# Patient Record
Sex: Male | Born: 1970 | Race: White | Hispanic: No | Smoking: Former smoker
Health system: Southern US, Community
[De-identification: ages and names within clinical notes are randomized; demographics above are authoritative.]

---

## 2005-06-17 ENCOUNTER — Ambulatory Visit: Payer: Self-pay | Admitting: Internal Medicine

## 2005-09-03 ENCOUNTER — Ambulatory Visit: Payer: Self-pay | Admitting: Oncology

## 2005-09-09 LAB — CBC WITH DIFFERENTIAL/PLATELET
LYMPH%: 22.8 % (ref 14.0–48.0)
MCH: 31 pg (ref 28.0–33.4)
MCV: 89.7 fL (ref 81.6–98.0)
MONO%: 4.8 % (ref 0.0–13.0)
NEUT#: 4.7 10*3/uL (ref 1.5–6.5)
NEUT%: 69.6 % (ref 40.0–75.0)
Platelets: 180 10*3/uL (ref 145–400)
RDW: 12.8 % (ref 11.2–14.6)
WBC: 6.7 10*3/uL (ref 4.0–10.0)
lymph#: 1.5 10*3/uL (ref 0.9–3.3)

## 2005-09-09 LAB — COMPREHENSIVE METABOLIC PANEL
Alkaline Phosphatase: 97 U/L (ref 39–117)
Potassium: 3.9 mEq/L (ref 3.5–5.3)
Sodium: 138 mEq/L (ref 135–145)
Total Bilirubin: 0.7 mg/dL (ref 0.3–1.2)

## 2005-09-11 ENCOUNTER — Ambulatory Visit (HOSPITAL_COMMUNITY): Admission: RE | Admit: 2005-09-11 | Discharge: 2005-09-11 | Payer: Self-pay | Admitting: Oncology

## 2005-09-12 LAB — BETA HCG QUANT (REF LAB): Beta hCG, Tumor Marker: <1 IU/L (ref 0–3)

## 2005-10-27 ENCOUNTER — Ambulatory Visit: Payer: Self-pay | Admitting: Oncology

## 2006-01-26 ENCOUNTER — Ambulatory Visit: Payer: Self-pay | Admitting: Oncology

## 2007-04-16 IMAGING — CT CT ABDOMEN W/ CM
2 of 6 series · 17 of 46 positions shown, 19 images · non-contrast
Comparison: none

HISTORY: Testicular cancer status post chemotherapy, cough

[Series 2: cap 5.0 b40f st · axial · 0.71mm/px · z∈[+780,+1404]mm · 14 of 143 slices shown, 16 images]
[im 9/143  soft-tissue]
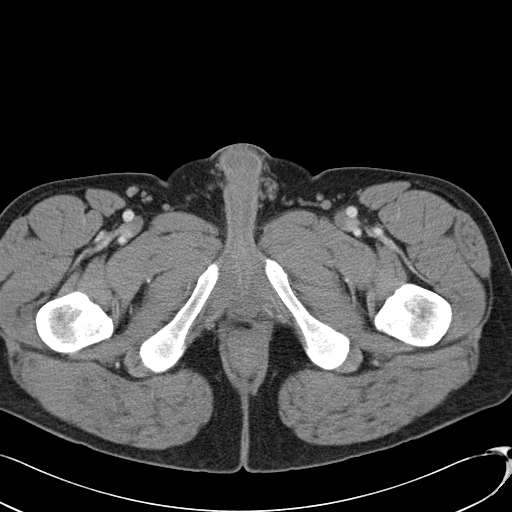
[im 9/143  bone]
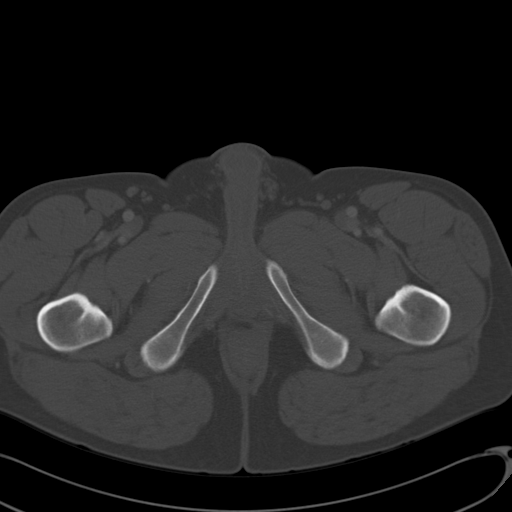
[im 17/143  soft-tissue]
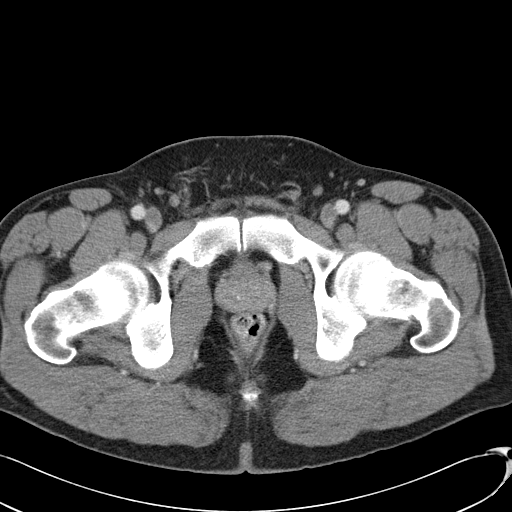
[im 26/143  soft-tissue]
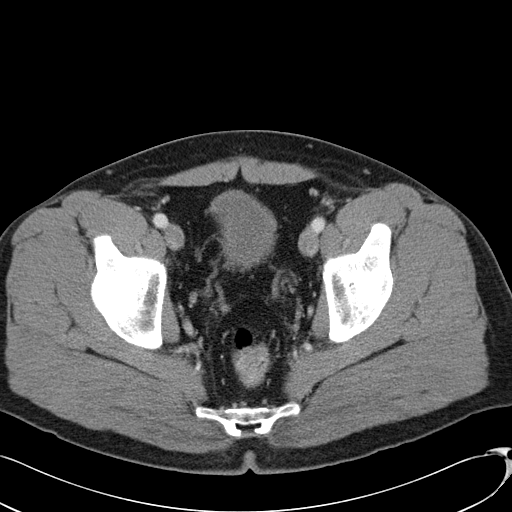
[im 42/143  soft-tissue]
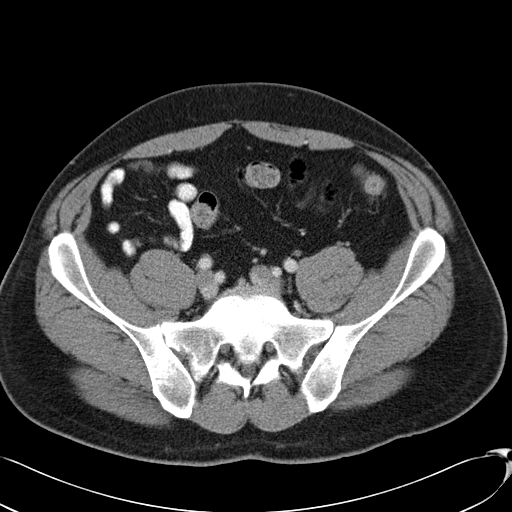
[im 51/143  soft-tissue]
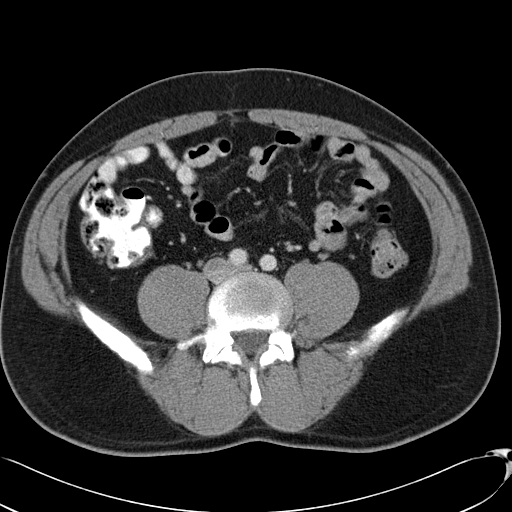
[im 59/143  soft-tissue]
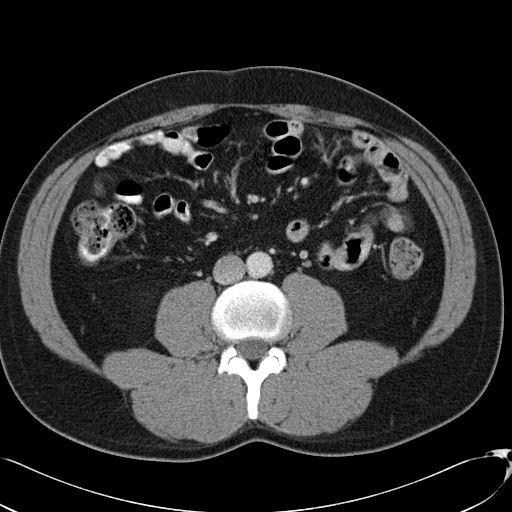
[im 67/143  soft-tissue]
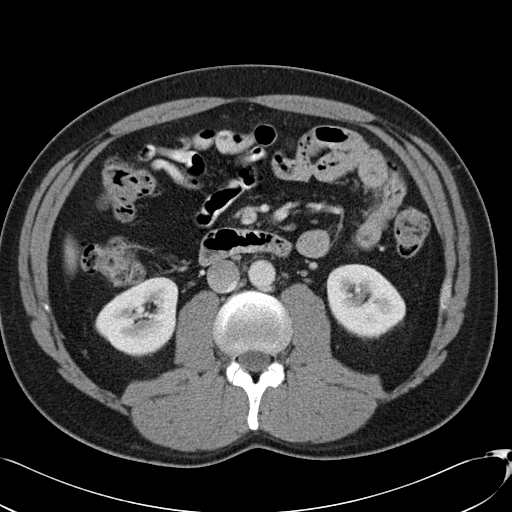
[im 76/143  soft-tissue]
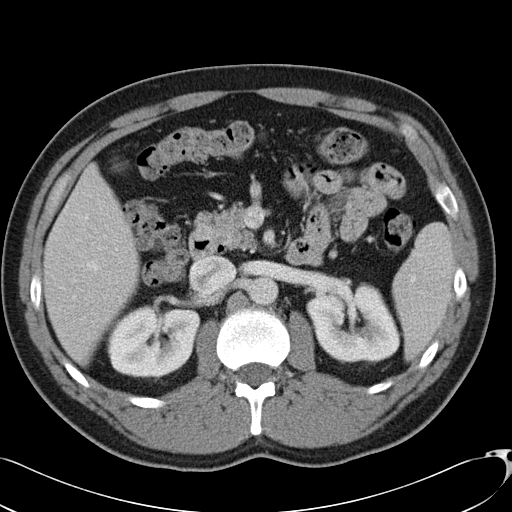
[im 84/143  soft-tissue]
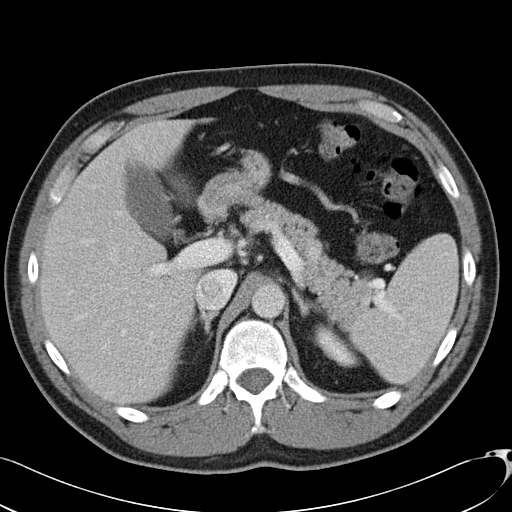
[im 84/143  bone]
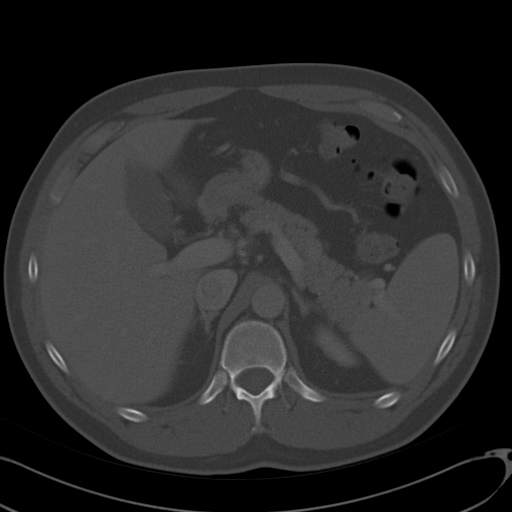
[im 92/143  soft-tissue]
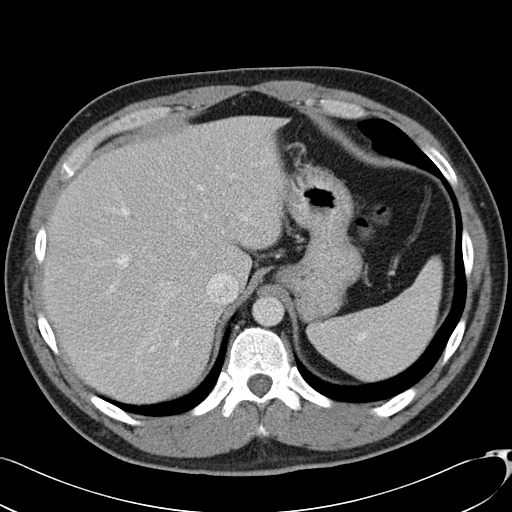
[im 109/143  soft-tissue]
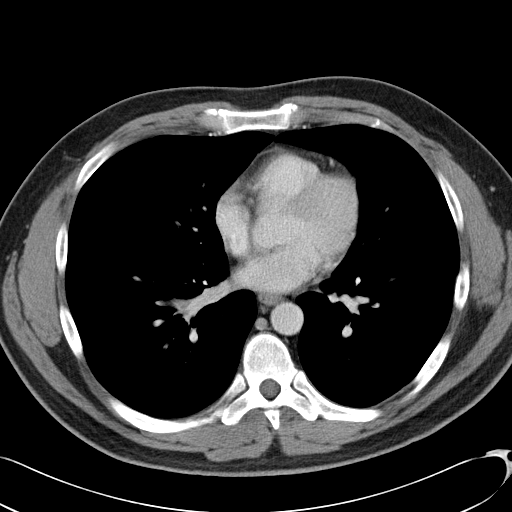
[im 117/143  soft-tissue]
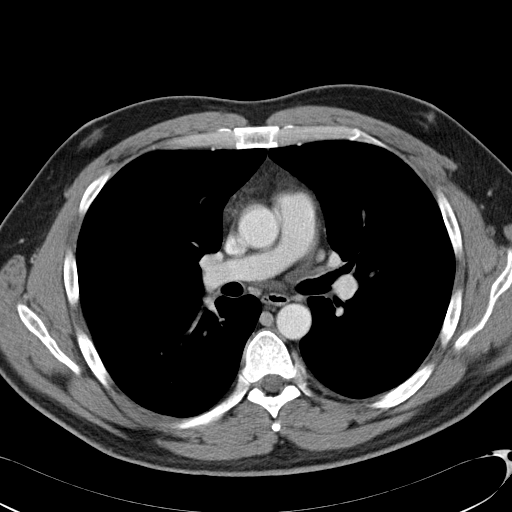
[im 126/143  soft-tissue]
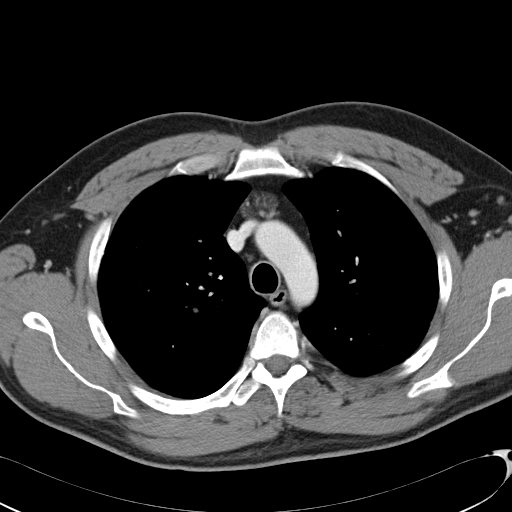
[im 134/143  soft-tissue]
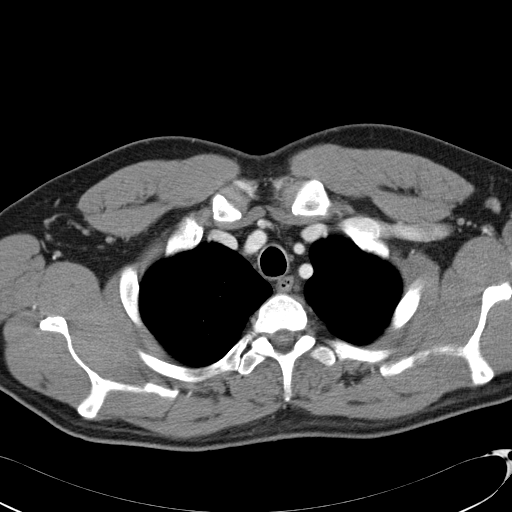

[Series 602: cor · coronal · 1.43mm/px · 3 of 50 slices shown]
[im 17/50  soft-tissue]
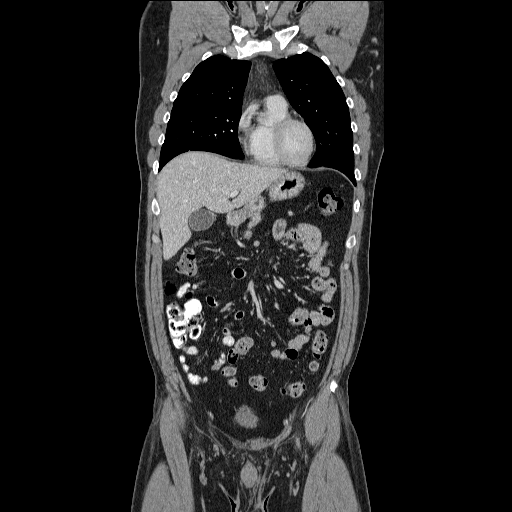
[im 22/50  soft-tissue]
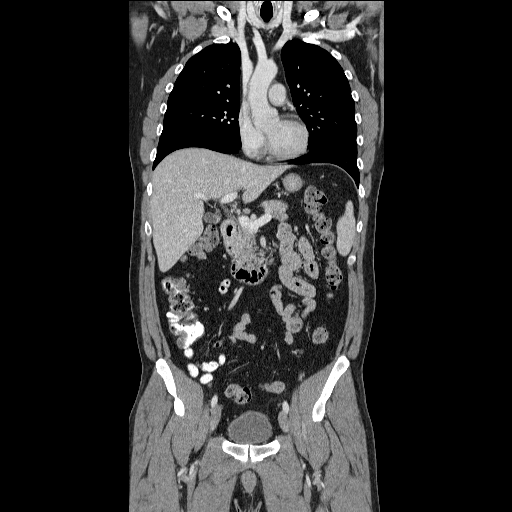
[im 28/50  soft-tissue]
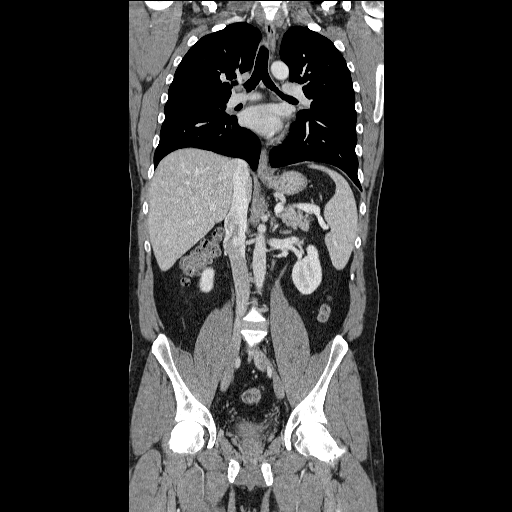

[17 of 46 positions shown; findings below may reference images not displayed]

CT CHEST, ABDOMEN, AND PELVIS WITH CONTRAST:

Multidetector helical CT imaging of chest, abdomen, and pelvis performed.
Exam utilized dilute oral contrast to monitor 25 cc [id number].
No prior examinations available for comparison

CT CHEST:

Thoracic vascular structures patent.
Normal size mediastinal, right hilar, and left axillary lymph nodes.
No pulmonary infiltrate or pleural effusion.
Minimal stranding density in anterior mediastinum likely residual thymus.
Tiny bilateral pulmonary nodules less than 5 mm diameter, nonspecific.
The report from the patient's prior outside CT mentions pulmonary nodules but
the films are not available for direct comparison.
No definite bone metastases.
IMPRESSION: Tiny bilateral pulmonary nodules, nonspecific.
Nodules are described on the report from a prior outside CT.
Recommend prior exams be obtained for direct comparison to assess for interval
change.

CT ABDOMEN:

Tiny nodular foci within gallbladder, question tiny polyps or nondependent
stones.
Liver, spleen, pancreas, kidneys, and adrenal glands normal.
Upper abdominal bowel loops incompletely opacified but otherwise grossly normal
in appearance.
No mass, adenopathy, free fluid, or inflammatory process.
IMPRESSION: No evidence of metastatic disease.
Question tiny gallbladder polyps versus stones.
If clinically indicated, this can be evaluated by ultrasound.

CT PELVIS:

Normal appendix.
Pelvic bowel loops unremarkable.
No pelvic mass, adenopathy, or free fluid.
No distal ureteral calcification or dilatation.
Bones unremarkable.
IMPRESSION: No evidence of metastatic disease.

## 2022-04-01 ENCOUNTER — Encounter: Payer: Self-pay | Admitting: Internal Medicine

## 2022-04-01 ENCOUNTER — Encounter: Payer: 59 | Admitting: Internal Medicine

## 2022-04-01 ENCOUNTER — Ambulatory Visit (INDEPENDENT_AMBULATORY_CARE_PROVIDER_SITE_OTHER): Payer: 59 | Admitting: Internal Medicine

## 2022-04-01 VITALS — BP 122/84 | HR 87 | Temp 98.2°F | Resp 18 | Ht 72.0 in | Wt 231.4 lb

## 2022-04-01 DIAGNOSIS — H8112 Benign paroxysmal vertigo, left ear: Secondary | ICD-10-CM

## 2022-04-01 DIAGNOSIS — Z131 Encounter for screening for diabetes mellitus: Secondary | ICD-10-CM

## 2022-04-01 DIAGNOSIS — N529 Male erectile dysfunction, unspecified: Secondary | ICD-10-CM | POA: Diagnosis not present

## 2022-04-01 DIAGNOSIS — Z0000 Encounter for general adult medical examination without abnormal findings: Secondary | ICD-10-CM | POA: Diagnosis not present

## 2022-04-01 DIAGNOSIS — Z125 Encounter for screening for malignant neoplasm of prostate: Secondary | ICD-10-CM | POA: Insufficient documentation

## 2022-04-01 MED ORDER — SILDENAFIL CITRATE 100 MG PO TABS: 100.0000 mg | ORAL_TABLET | ORAL | 11 refills | Status: DC | PRN

## 2022-04-01 MED ORDER — MECLIZINE HCL 25 MG PO TABS: 25.0000 mg | ORAL_TABLET | Freq: Three times a day (TID) | ORAL | 3 refills | Status: DC | PRN

## 2022-04-01 NOTE — Progress Notes (Addendum)
   Established Patient Office Visit  Subjective   Patient ID: Erik Henry, male    DOB: 04/06/1971  Age: 51 y.o. MRN: [id number]  Chief Complaint  Patient presents with   Annual Exam   Erectile Dysfunction    Erectile Dysfunction   51 years old male is here for annual physical exam, he has sinusitis and get positional vertigo.  Does not take any medications.  He denies having any specific complaint.  He does have a history of erectile dysfunction and takes sildenafil as needed.  He has testicular cancer and receive chemo therapy.   His mother died of lung cancer. Father died of colon cancer.   He work picking parts in high rise building.   He quit smoking 2 years ago after smoking 35 years 1 PPD. He occasionally drink.    Review of Systems  Constitutional: Negative.   Eyes: Negative.   Respiratory: Negative.    Cardiovascular: Negative.   Gastrointestinal: Negative.   Neurological:  Positive for dizziness.      Objective:     BP 122/84 (BP Location: Left Arm, Patient Position: Sitting)   Pulse 87   Temp 98.2 F (36.8 C)   Resp 18   Ht 6' (1.829 m)   Wt 231 lb 6 oz (105 kg)   SpO2 98%   BMI 31.38 kg/m    Physical Exam Constitutional:      Appearance: Normal appearance.  HENT:     Head: Normocephalic and atraumatic.  Eyes:     Extraocular Movements: Extraocular movements intact.     Pupils: Pupils are equal, round, and reactive to light.  Cardiovascular:     Rate and Rhythm: Normal rate and regular rhythm.     Heart sounds: Normal heart sounds.  Pulmonary:     Effort: Pulmonary effort is normal.     Breath sounds: Normal breath sounds.  Abdominal:     General: Bowel sounds are normal.     Palpations: Abdomen is soft.  Musculoskeletal:     Cervical back: Normal range of motion and neck supple.  Neurological:     General: No focal deficit present.     Mental Status: He is alert and oriented to person, place, and time.      No results found  for any visits on 04/01/22.   The ASCVD Risk score (Arnett DK, et al., 2019) failed to calculate for the following reasons:   Cannot find a previous HDL lab   Cannot find a previous total cholesterol lab    Assessment & Plan:   Problem List Items Addressed This Visit       Nervous and Auditory   Benign paroxysmal positional vertigo of left ear - Primary    He will take meclizine as needed.        Other   Erectile dysfunction    I will do testosterone level.  He will continue to take sildenafil.      Relevant Orders   Testosterone, Total (Completed)   Screening for diabetes mellitus    I will do hemoglobin A1c today.  He is BMI is 3 and he has lost some weight by monitoring his diet.      Relevant Orders   Hemoglobin A1c (Completed)    No follow-ups on file.    Garwin Brothers, MD

## 2022-04-02 LAB — CMP14 + ANION GAP
ALT: 28 IU/L (ref 0–44)
AST: 19 IU/L (ref 0–40)
Albumin/Globulin Ratio: 2.1 (ref 1.2–2.2)
Albumin: 4.6 g/dL (ref 3.8–4.9)
Alkaline Phosphatase: 105 IU/L (ref 44–121)
Anion Gap: 15 mmol/L (ref 10.0–18.0)
BUN/Creatinine Ratio: 12 (ref 9–20)
BUN: 11 mg/dL (ref 6–24)
Bilirubin Total: 0.8 mg/dL (ref 0.0–1.2)
CO2: 24 mmol/L (ref 20–29)
Calcium: 10 mg/dL (ref 8.7–10.2)
Chloride: 102 mmol/L (ref 96–106)
Creatinine, Ser: 0.94 mg/dL (ref 0.76–1.27)
Globulin, Total: 2.2 g/dL (ref 1.5–4.5)
Glucose: 120 mg/dL — ABNORMAL HIGH (ref 70–99)
Potassium: 4.3 mmol/L (ref 3.5–5.2)
Sodium: 141 mmol/L (ref 134–144)
Total Protein: 6.8 g/dL (ref 6.0–8.5)
eGFR: 97 mL/min/{1.73_m2} (ref >59)

## 2022-04-02 LAB — LIPID PANEL
Chol/HDL Ratio: 4.8 ratio (ref 0.0–5.0)
Cholesterol, Total: 183 mg/dL (ref 100–199)
HDL: 38 mg/dL — ABNORMAL LOW (ref >39)
LDL Chol Calc (NIH): 98 mg/dL (ref 0–99)
Triglycerides: 275 mg/dL — ABNORMAL HIGH (ref 0–149)
VLDL Cholesterol Cal: 47 mg/dL — ABNORMAL HIGH (ref 5–40)

## 2022-04-02 LAB — TESTOSTERONE: Testosterone: 282 ng/dL (ref 264–916)

## 2022-04-02 LAB — HEMOGLOBIN A1C
Est. average glucose Bld gHb Est-mCnc: 108 mg/dL
Hgb A1c MFr Bld: 5.4 % (ref 4.8–5.6)

## 2022-04-02 LAB — VITAMIN D 25 HYDROXY (VIT D DEFICIENCY, FRACTURES): Vit D, 25-Hydroxy: 65.3 ng/mL (ref 30.0–100.0)

## 2022-04-02 LAB — PSA: Prostate Specific Ag, Serum: 1.9 ng/mL (ref 0.0–4.0)

## 2022-04-22 NOTE — Progress Notes (Signed)
Patient called.  Patient aware.  

## 2022-07-03 NOTE — Assessment & Plan Note (Signed)
I will do testosterone level.  He will continue to take sildenafil.

## 2022-07-03 NOTE — Assessment & Plan Note (Signed)
I will do hemoglobin A1c today.  He is BMI is 3 and he has lost some weight by monitoring his diet.

## 2022-07-03 NOTE — Assessment & Plan Note (Signed)
He will take meclizine as needed.

## 2023-04-09 ENCOUNTER — Ambulatory Visit: Payer: BC Managed Care – PPO | Admitting: Internal Medicine

## 2023-04-09 ENCOUNTER — Encounter: Payer: Self-pay | Admitting: Internal Medicine

## 2023-04-09 VITALS — BP 128/90 | HR 91 | Temp 97.8°F | Resp 18 | Ht 72.0 in | Wt 230.2 lb

## 2023-04-09 DIAGNOSIS — Z6830 Body mass index (BMI) 30.0-30.9, adult: Secondary | ICD-10-CM | POA: Insufficient documentation

## 2023-04-09 DIAGNOSIS — J302 Other seasonal allergic rhinitis: Secondary | ICD-10-CM

## 2023-04-09 DIAGNOSIS — Z125 Encounter for screening for malignant neoplasm of prostate: Secondary | ICD-10-CM | POA: Diagnosis not present

## 2023-04-09 DIAGNOSIS — N529 Male erectile dysfunction, unspecified: Secondary | ICD-10-CM

## 2023-04-09 DIAGNOSIS — Z0000 Encounter for general adult medical examination without abnormal findings: Secondary | ICD-10-CM | POA: Diagnosis not present

## 2023-04-09 DIAGNOSIS — E291 Testicular hypofunction: Secondary | ICD-10-CM | POA: Diagnosis not present

## 2023-04-09 DIAGNOSIS — Z6831 Body mass index (BMI) 31.0-31.9, adult: Secondary | ICD-10-CM

## 2023-04-09 DIAGNOSIS — H8112 Benign paroxysmal vertigo, left ear: Secondary | ICD-10-CM | POA: Diagnosis not present

## 2023-04-09 MED ORDER — FLUTICASONE PROPIONATE 50 MCG/ACT NA SUSP: 1.0000 | Freq: Every day | NASAL | 2 refills | Status: AC

## 2023-04-09 NOTE — Progress Notes (Addendum)
Office Visit  Subjective   Patient ID: Erik Henry   DOB: [birth year]   Age: 52 y.o.   MRN: [id number]   Chief Complaint Chief Complaint  Patient presents with   Annual Exam     History of Present Illness 52 years old male is here for annual physical examination. He says within last whole year, he has one episode of positional vertigo. He has seasonal allergies and he use flonase and it is helping.   He has no energy and feeling weak muscles.  He also has problems with his nature and his testosterone level was low but he is not taking any replacement.  He is fasting today for repeat labs.  He has history of testicular cancer and receive chemo therapy in the past.   He is single and work as Hydrologist.    His mother died of lung cancer. Father died of pancreatic cancer.    He works as Hydrologist. He has quit smoking 3 years ago after smoking 35 years 1 PPD. He occasionally drink.   He does not get flu shot, he has COVID vaccine.   He never have colonoscopy but had stool fit test last year and wanted to do that again this year.  He says that he wake up once at nighttime to urinate but he drink a lot of sweet tea.  He denies having any difficulty urination.  Past Medical History No past medical history on file.   Allergies No Known Allergies   Review of Systems Review of Systems  Constitutional: Negative.   HENT: Negative.    Respiratory: Negative.    Cardiovascular: Negative.   Gastrointestinal: Negative.   Neurological: Negative.        Objective:    Vitals BP (!) 128/90 (BP Location: Left Arm, Patient Position: Sitting, Cuff Size: Normal)   Pulse 91   Temp 97.8 F (36.6 C)   Resp 18   Ht 6' (1.829 m)   Wt 230 lb 4 oz (104.4 kg)   SpO2 98%   BMI 31.23 kg/m    Physical Examination Physical Exam Constitutional:      Appearance: Normal appearance. He is normal weight.  HENT:     Head: Normocephalic and atraumatic.  Eyes:     Extraocular  Movements: Extraocular movements intact.     Pupils: Pupils are equal, round, and reactive to light.  Cardiovascular:     Rate and Rhythm: Normal rate and regular rhythm.     Heart sounds: Normal heart sounds.  Pulmonary:     Effort: Pulmonary effort is normal.     Breath sounds: Normal breath sounds.  Abdominal:     General: Bowel sounds are normal.     Palpations: Abdomen is soft.  Neurological:     General: No focal deficit present.     Mental Status: He is alert and oriented to person, place, and time.        Assessment & Plan:   Hypogonadism male He is fasting today I will do testosterone level.  Benign paroxysmal positional vertigo of left ear stable and mostly related with nasal congestion.  BMI 31.0-31.9,adult He will continue to watch his diet and do exercise.  Erectile dysfunction I will reevaluate after his testosterone level.  Screening for prostate cancer I will do PSA.  Seasonal allergies He is using Flonase at nighttime and that is helping.  He was given stool fit test kit.  I have also given him a note for  work.    Return in about 6 months (around 10/08/2023).   Eloisa Northern, MD

## 2023-04-09 NOTE — Assessment & Plan Note (Signed)
He is fasting today I will do testosterone level.

## 2023-04-09 NOTE — Assessment & Plan Note (Signed)
I will reevaluate after his testosterone level.

## 2023-04-09 NOTE — Assessment & Plan Note (Signed)
He will continue to watch his diet and do exercise.  

## 2023-04-09 NOTE — Assessment & Plan Note (Signed)
I will do PSA.

## 2023-04-09 NOTE — Assessment & Plan Note (Signed)
He is using Flonase at nighttime and that is helping.

## 2023-04-09 NOTE — Assessment & Plan Note (Signed)
stable and mostly related with nasal congestion.

## 2023-04-10 LAB — CBC WITH DIFFERENTIAL/PLATELET
Basophils Absolute: 0 10*3/uL (ref 0.0–0.2)
Basos: 0 %
EOS (ABSOLUTE): 0.2 10*3/uL (ref 0.0–0.4)
Eos: 3 %
Hematocrit: 49.5 % (ref 37.5–51.0)
Hemoglobin: 16.9 g/dL (ref 13.0–17.7)
Immature Grans (Abs): 0 10*3/uL (ref 0.0–0.1)
Immature Granulocytes: 0 %
Lymphocytes Absolute: 1.4 10*3/uL (ref 0.7–3.1)
Lymphs: 23 %
MCH: 31.9 pg (ref 26.6–33.0)
MCHC: 34.1 g/dL (ref 31.5–35.7)
MCV: 94 fL (ref 79–97)
Monocytes Absolute: 0.4 10*3/uL (ref 0.1–0.9)
Monocytes: 7 %
Neutrophils Absolute: 4.1 10*3/uL (ref 1.4–7.0)
Neutrophils: 67 %
Platelets: 145 10*3/uL — ABNORMAL LOW (ref 150–450)
RBC: 5.29 x10E6/uL (ref 4.14–5.80)
RDW: 12.3 % (ref 11.6–15.4)
WBC: 6 10*3/uL (ref 3.4–10.8)

## 2023-04-10 LAB — CMP14 + ANION GAP
ALT: 36 [IU]/L (ref 0–44)
AST: 23 [IU]/L (ref 0–40)
Albumin: 4.7 g/dL (ref 3.8–4.9)
Alkaline Phosphatase: 113 [IU]/L (ref 44–121)
Anion Gap: 13 mmol/L (ref 10.0–18.0)
BUN/Creatinine Ratio: 14 (ref 9–20)
BUN: 14 mg/dL (ref 6–24)
Bilirubin Total: 1 mg/dL (ref 0.0–1.2)
CO2: 26 mmol/L (ref 20–29)
Calcium: 9.8 mg/dL (ref 8.7–10.2)
Chloride: 103 mmol/L (ref 96–106)
Creatinine, Ser: 0.99 mg/dL (ref 0.76–1.27)
Globulin, Total: 2 g/dL (ref 1.5–4.5)
Glucose: 100 mg/dL — ABNORMAL HIGH (ref 70–99)
Potassium: 4.5 mmol/L (ref 3.5–5.2)
Sodium: 142 mmol/L (ref 134–144)
Total Protein: 6.7 g/dL (ref 6.0–8.5)
eGFR: 91 mL/min/{1.73_m2} (ref >59)

## 2023-04-10 LAB — LIPID PANEL
Chol/HDL Ratio: 5.2 {ratio} — ABNORMAL HIGH (ref 0.0–5.0)
Cholesterol, Total: 196 mg/dL (ref 100–199)
HDL: 38 mg/dL — ABNORMAL LOW (ref >39)
LDL Chol Calc (NIH): 130 mg/dL — ABNORMAL HIGH (ref 0–99)
Triglycerides: 158 mg/dL — ABNORMAL HIGH (ref 0–149)
VLDL Cholesterol Cal: 28 mg/dL (ref 5–40)

## 2023-04-10 LAB — TESTOSTERONE: Testosterone: 368 ng/dL (ref 264–916)

## 2023-04-10 LAB — PSA: Prostate Specific Ag, Serum: 2 ng/mL (ref 0.0–4.0)

## 2023-04-10 LAB — TSH: TSH: 0.948 u[IU]/mL (ref 0.450–4.500)

## 2023-04-21 NOTE — Progress Notes (Signed)
Patient called.  Patient aware. His labs are good.

## 2023-10-08 ENCOUNTER — Ambulatory Visit: Payer: BC Managed Care – PPO | Admitting: Internal Medicine

## 2023-10-16 ENCOUNTER — Encounter: Payer: Self-pay | Admitting: Internal Medicine

## 2023-10-16 ENCOUNTER — Ambulatory Visit: Admitting: Internal Medicine

## 2023-10-16 VITALS — BP 124/78 | HR 97 | Temp 98.6°F | Resp 18 | Ht 79.0 in | Wt 221.5 lb

## 2023-10-16 DIAGNOSIS — E785 Hyperlipidemia, unspecified: Secondary | ICD-10-CM | POA: Diagnosis not present

## 2023-10-16 DIAGNOSIS — J302 Other seasonal allergic rhinitis: Secondary | ICD-10-CM

## 2023-10-16 NOTE — Progress Notes (Unsigned)
   Office Visit  Subjective   Patient ID: Erik Henry   DOB: 10/27/1970   Age: 53 y.o.   MRN: [id number]   Chief Complaint Chief Complaint  Patient presents with   Follow-up    6 month     History of Present Illness HPI   Past Medical History No past medical history on file.   Allergies No Known Allergies   Review of Systems ROS     Objective:    Vitals BP 124/78 (BP Location: Left Arm, Patient Position: Sitting, Cuff Size: Normal)   Pulse 97   Temp 98.6 F (37 C)   Resp 18   Ht 6' 7 (2.007 m)   Wt 221 lb 8 oz (100.5 kg)   SpO2 97%   BMI 24.95 kg/m    Physical Examination Physical Exam     Assessment & Plan:   No problem-specific Assessment & Plan notes found for this encounter.    Return in about 1 year (around 10/15/2024).   Tita Form, MD

## 2023-10-17 NOTE — Assessment & Plan Note (Signed)
 He will continue to use nasal spray

## 2023-10-17 NOTE — Assessment & Plan Note (Signed)
 His 10 year cardiovascular risk is 11%.  He does not wanted to take any medicine.  He will continue to watch his diet.  Will repeat lipid panel next year.

## 2023-11-18 ENCOUNTER — Ambulatory Visit: Admitting: Internal Medicine

## 2023-11-18 VITALS — BP 126/84 | HR 88 | Temp 98.1°F | Resp 18 | Ht 72.0 in | Wt 222.0 lb

## 2023-11-18 DIAGNOSIS — H66002 Acute suppurative otitis media without spontaneous rupture of ear drum, left ear: Secondary | ICD-10-CM | POA: Diagnosis not present

## 2023-11-18 MED ORDER — AMOXICILLIN-POT CLAVULANATE 875-125 MG PO TABS: 1.0000 | ORAL_TABLET | Freq: Two times a day (BID) | ORAL | 0 refills | Status: DC

## 2023-11-18 NOTE — Progress Notes (Unsigned)
   Acute Office Visit  Subjective:     Patient ID: Erik Henry, male    DOB: [birth year], 53 y.o.   MRN: [id number]  Chief Complaint  Patient presents with   office visit    Patient here for left  ear infection     HPI Patient is in today for pain in his both ears left worse than right.  He says that these are the same symptoms when he has ear infection before.  He tried to put hydrogen peroxide in his ears without any relief.  No drainage.  He says that he has no stay stuffy all the time.  No fever or chills.  He denies being in a swimming pool.  Review of Systems  Constitutional: Negative.   HENT:  Positive for congestion and ear pain.   Respiratory: Negative.    Cardiovascular: Negative.         Objective:    BP 126/84   Pulse 88   Temp 98.1 F (36.7 C)   Resp 18   Ht 6' (1.829 m)   Wt 222 lb (100.7 kg)   SpO2 96%   BMI 30.11 kg/m    Physical Exam Constitutional:      Appearance: Normal appearance.  HENT:     Right Ear: Tympanic membrane, ear canal and external ear normal.     Ears:     Comments:   Fluid in his left middle ear. Eyes:     Extraocular Movements: Extraocular movements intact.     Pupils: Pupils are equal, round, and reactive to light.  Pulmonary:     Effort: Pulmonary effort is normal.     Breath sounds: Normal breath sounds.  Abdominal:     General: Bowel sounds are normal.     Palpations: Abdomen is soft.  Neurological:     Mental Status: He is alert.     No results found for any visits on 11/18/23.      Assessment & Plan:   Problem List Items Addressed This Visit       Nervous and Auditory   Non-recurrent acute suppurative otitis media of left ear without spontaneous rupture of tympanic membrane - Primary     He will take Augmentin  1 tablets twice a day for 10 days.  If his symptoms are not better then he will come back.      Relevant Medications   amoxicillin -clavulanate (AUGMENTIN ) 875-125 MG tablet    No orders of  the defined types were placed in this encounter.   He also need note for work that he was here today.  Return in about 6 months (around 05/20/2024) for For PE.  Roetta Dare, MD

## 2023-11-19 NOTE — Assessment & Plan Note (Signed)
 He will take Augmentin  1 tablets twice a day for 10 days.  If his symptoms are not better then he will come back.

## 2023-12-04 ENCOUNTER — Ambulatory Visit: Admitting: Internal Medicine

## 2023-12-04 ENCOUNTER — Encounter: Payer: Self-pay | Admitting: Internal Medicine

## 2023-12-04 VITALS — BP 130/90 | HR 84 | Temp 98.5°F | Resp 18 | Ht 72.0 in | Wt 223.0 lb

## 2023-12-04 DIAGNOSIS — H6692 Otitis media, unspecified, left ear: Secondary | ICD-10-CM

## 2023-12-04 MED ORDER — CEFDINIR 300 MG PO CAPS: 300.0000 mg | ORAL_CAPSULE | Freq: Two times a day (BID) | ORAL | 0 refills | Status: AC

## 2023-12-04 NOTE — Assessment & Plan Note (Signed)
 Fluid in his ear is much better but he still has some fluid.  I will give him Ceftin 300 mg twice a day 1st 10 days.  He will eat a lot of yogurt.  He will call if is not better.

## 2023-12-04 NOTE — Progress Notes (Signed)
   Acute Office Visit  Subjective:     Patient ID: Erik Henry, male    DOB: February 28, 1971, 53 y.o.   MRN: [id number]  Chief Complaint  Patient presents with   Otalgia    Left ear    HPI Patient is in today for follow-up of ear infection.  He says that he is are much better but he thing that he may have a very little fluid and may need another course of antibiotic.  No drainage.  He does has a pain and feeling of pressure in his left ear.  Review of Systems  Constitutional: Negative.   HENT:  Positive for ear pain.   Respiratory: Negative.    Cardiovascular: Negative.         Objective:    BP (!) 130/90   Pulse 84   Temp 98.5 F (36.9 C)   Resp 18   Ht 6' (1.829 m)   Wt 223 lb (101.2 kg)   SpO2 97%   BMI 30.24 kg/m    Physical Exam Constitutional:      Appearance: Normal appearance.  HENT:     Head: Normocephalic and atraumatic.     Ears:     Comments:   Fluid in his left ear. Cardiovascular:     Rate and Rhythm: Normal rate and regular rhythm.     Heart sounds: Normal heart sounds.  Neurological:     Mental Status: He is alert.     No results found for any visits on 12/04/23.      Assessment & Plan:   Problem List Items Addressed This Visit       Nervous and Auditory   Left otitis media - Primary     Fluid in his ear is much better but he still has some fluid.  I will give him Ceftin 300 mg twice a day 1st 10 days.  He will eat a lot of yogurt.  He will call if is not better.      Relevant Medications   cefdinir  (OMNICEF ) 300 MG capsule    Meds ordered this encounter  Medications   cefdinir  (OMNICEF ) 300 MG capsule    Sig: Take 1 capsule (300 mg total) by mouth 2 (two) times daily.    Dispense:  20 capsule    Refill:  0    No follow-ups on file.  Roetta Dare, MD

## 2024-04-12 ENCOUNTER — Encounter: Payer: Self-pay | Admitting: Internal Medicine

## 2024-04-12 ENCOUNTER — Ambulatory Visit: Admitting: Internal Medicine

## 2024-04-12 VITALS — BP 124/80 | HR 95 | Temp 98.0°F | Resp 18 | Ht 72.0 in | Wt 223.5 lb

## 2024-04-12 DIAGNOSIS — R21 Rash and other nonspecific skin eruption: Secondary | ICD-10-CM | POA: Diagnosis not present

## 2024-04-12 DIAGNOSIS — Z0000 Encounter for general adult medical examination without abnormal findings: Secondary | ICD-10-CM | POA: Diagnosis not present

## 2024-04-12 DIAGNOSIS — E785 Hyperlipidemia, unspecified: Secondary | ICD-10-CM

## 2024-04-12 DIAGNOSIS — Z125 Encounter for screening for malignant neoplasm of prostate: Secondary | ICD-10-CM | POA: Diagnosis not present

## 2024-04-12 DIAGNOSIS — Z131 Encounter for screening for diabetes mellitus: Secondary | ICD-10-CM | POA: Insufficient documentation

## 2024-04-12 DIAGNOSIS — Z6830 Body mass index (BMI) 30.0-30.9, adult: Secondary | ICD-10-CM

## 2024-04-12 MED ORDER — TRIAMCINOLONE ACETONIDE 0.025 % EX CREA: 1.0000 | TOPICAL_CREAM | Freq: Two times a day (BID) | CUTANEOUS | 3 refills | Status: AC

## 2024-04-12 NOTE — Assessment & Plan Note (Signed)
 He do not want any vaccine.  I will send Cologuard and do PSA level today.

## 2024-04-12 NOTE — Assessment & Plan Note (Signed)
 He will continue to watch his diet.  He will also do regular exercise.

## 2024-04-12 NOTE — Assessment & Plan Note (Signed)
I will do hemoglobin A1c.

## 2024-04-12 NOTE — Assessment & Plan Note (Signed)
 His 10 year cardiovascular risk is 12%.  He do not wanted to take any medicine or statin.  I will repeat lipid panel today.

## 2024-04-12 NOTE — Assessment & Plan Note (Signed)
 Not sure the nature of rashes as he do not have any rash or itching.  I will send triamcinolone  cream to use as needed.

## 2024-04-12 NOTE — Progress Notes (Signed)
 Office Visit  Subjective   Patient ID: Erik Henry   DOB: 1970-11-21   Age: 53 y.o.   MRN: [id number]   Chief Complaint Chief Complaint  Patient presents with   Annual Exam    Physcial     History of Present Illness 54 years old male is here for annual physical examination. He is single and work as hydrologist. He started smoking again. He works as hydrologist. He has quit smoking 3 years ago after smoking 35 years 1 PPD but because of stress, he restart smoking again, but he says that he is going to quit again. He occasionally drink.   He does not get flu shot.  He only has COVID vaccine but he regrets and he does not wanted to get booster.  He do not wanted to get tetanus vaccine booster either.   He has history of testicular cancer and receive chemo therapy in the past.      His mother died of lung cancer. Father died of prostate cancer.     His weight is 223 lb with BMI of 30.  He has lost 7 lb since last year.    He is complaining of rash and itching on his left neck from earlobe to middle of neck.  He says that it comes and goes.  He denies any particular thing that trigger rash.  Currently he do not have any rash.   He never have colonoscopy but had stool fit test last year and wanted to do that again this year. He says that he wake up once at nighttime to urinate but he drink a lot of sweet tea.  He denies having any difficulty urination.  Past Medical History History reviewed. No pertinent past medical history.   Allergies No Known Allergies   Review of Systems Review of Systems  Constitutional: Negative.   HENT: Negative.    Respiratory: Negative.    Cardiovascular: Negative.   Gastrointestinal: Negative.   Neurological: Negative.        Objective:    Vitals BP 124/80 (BP Location: Left Arm, Patient Position: Sitting, Cuff Size: Normal)   Pulse 95   Temp 98 F (36.7 C)   Resp 18   Ht 6' (1.829 m)   Wt 223 lb 8 oz (101.4 kg)   SpO2 97%    BMI 30.31 kg/m    Physical Examination Physical Exam Constitutional:      Appearance: Normal appearance.  HENT:     Head: Normocephalic and atraumatic.  Eyes:     Extraocular Movements: Extraocular movements intact.     Pupils: Pupils are equal, round, and reactive to light.  Cardiovascular:     Rate and Rhythm: Normal rate and regular rhythm.     Heart sounds: Normal heart sounds.  Pulmonary:     Effort: Pulmonary effort is normal.     Breath sounds: Normal breath sounds.  Abdominal:     General: Bowel sounds are normal.  Neurological:     General: No focal deficit present.     Mental Status: He is alert and oriented to person, place, and time.        Assessment & Plan:   Rash   Not sure the nature of rashes as he do not have any rash or itching.  I will send triamcinolone  cream to use as needed.  Screening for prostate cancer   I will do PSA level.  Screening for diabetes mellitus (DM)   I will  do hemoglobin A1c.  Hyperlipidemia   His 10 year cardiovascular risk is 12%.  He do not wanted to take any medicine or statin.  I will repeat lipid panel today.  BMI 30.0-30.9,adult   He will continue to watch his diet.  He will also do regular exercise.  Annual physical exam   He do not want any vaccine.  I will send Cologuard and do PSA level today.    Return in about 1 year (around 04/12/2025).   Roetta Dare, MD

## 2024-04-12 NOTE — Assessment & Plan Note (Signed)
 I will do PSA level.

## 2024-04-13 ENCOUNTER — Ambulatory Visit: Payer: Self-pay

## 2024-04-13 LAB — CBC WITH DIFFERENTIAL/PLATELET
Basophils Absolute: 0 x10E3/uL (ref 0.0–0.2)
Basos: 1 %
EOS (ABSOLUTE): 0.2 x10E3/uL (ref 0.0–0.4)
Eos: 3 %
Hematocrit: 49 % (ref 37.5–51.0)
Hemoglobin: 16.8 g/dL (ref 13.0–17.7)
Immature Grans (Abs): 0 x10E3/uL (ref 0.0–0.1)
Immature Granulocytes: 0 %
Lymphocytes Absolute: 1.2 x10E3/uL (ref 0.7–3.1)
Lymphs: 21 %
MCH: 31.8 pg (ref 26.6–33.0)
MCHC: 34.3 g/dL (ref 31.5–35.7)
MCV: 93 fL (ref 79–97)
Monocytes Absolute: 0.3 x10E3/uL (ref 0.1–0.9)
Monocytes: 5 %
Neutrophils Absolute: 3.9 x10E3/uL (ref 1.4–7.0)
Neutrophils: 70 %
Platelets: 135 x10E3/uL — ABNORMAL LOW (ref 150–450)
RBC: 5.29 x10E6/uL (ref 4.14–5.80)
RDW: 12.8 % (ref 11.6–15.4)
WBC: 5.6 x10E3/uL (ref 3.4–10.8)

## 2024-04-13 LAB — CMP14 + ANION GAP
ALT: 25 IU/L (ref 0–44)
AST: 20 IU/L (ref 0–40)
Albumin: 4.7 g/dL (ref 3.8–4.9)
Alkaline Phosphatase: 101 IU/L (ref 47–123)
Anion Gap: 14 mmol/L (ref 10.0–18.0)
BUN/Creatinine Ratio: 13 (ref 9–20)
BUN: 11 mg/dL (ref 6–24)
Bilirubin Total: 0.7 mg/dL (ref 0.0–1.2)
CO2: 25 mmol/L (ref 20–29)
Calcium: 9.6 mg/dL (ref 8.7–10.2)
Chloride: 105 mmol/L (ref 96–106)
Creatinine, Ser: 0.85 mg/dL (ref 0.76–1.27)
Globulin, Total: 2 g/dL (ref 1.5–4.5)
Glucose: 121 mg/dL — ABNORMAL HIGH (ref 70–99)
Potassium: 4.3 mmol/L (ref 3.5–5.2)
Sodium: 144 mmol/L (ref 134–144)
Total Protein: 6.7 g/dL (ref 6.0–8.5)
eGFR: 103 mL/min/1.73 (ref >59)

## 2024-04-13 LAB — LIPID PANEL
Chol/HDL Ratio: 4 ratio (ref 0.0–5.0)
Cholesterol, Total: 167 mg/dL (ref 100–199)
HDL: 42 mg/dL (ref >39)
LDL Chol Calc (NIH): 96 mg/dL (ref 0–99)
Triglycerides: 166 mg/dL — ABNORMAL HIGH (ref 0–149)
VLDL Cholesterol Cal: 29 mg/dL (ref 5–40)

## 2024-04-13 LAB — HEMOGLOBIN A1C
Est. average glucose Bld gHb Est-mCnc: 108 mg/dL
Hgb A1c MFr Bld: 5.4 % (ref 4.8–5.6)

## 2024-04-13 LAB — PSA: Prostate Specific Ag, Serum: 0.9 ng/mL (ref 0.0–4.0)

## 2024-04-13 LAB — VITAMIN D 25 HYDROXY (VIT D DEFICIENCY, FRACTURES): Vit D, 25-Hydroxy: 43.7 ng/mL (ref 30.0–100.0)

## 2024-04-13 LAB — TSH: TSH: 0.775 u[IU]/mL (ref 0.450–4.500)

## 2024-04-13 NOTE — Progress Notes (Signed)
 Patient called.  Left message for patient to call back. His labs are good

## 2024-04-13 NOTE — Progress Notes (Signed)
 Patient called.  Patient aware. His labs are good.

## 2024-05-20 LAB — COLOGUARD: COLOGUARD: POSITIVE — AB

## 2024-06-02 NOTE — Progress Notes (Signed)
 Patient called.  Unable to reach patient. Mailing letter to contact office   Dr caleen : His Cologuard is positive and need to refer him to see GI. Please ask him which GI he wanted to see for colonoscopy

## 2025-04-18 ENCOUNTER — Encounter: Admitting: Internal Medicine
